# Patient Record
Sex: Male | Born: 2000 | Race: White | Hispanic: No | Marital: Single | Smoking: Never smoker
Health system: Southern US, Community
[De-identification: ages and names within clinical notes are randomized; demographics above are authoritative.]

## PROBLEM LIST (undated history)

## (undated) DIAGNOSIS — J302 Other seasonal allergic rhinitis: Secondary | ICD-10-CM

## (undated) DIAGNOSIS — J45909 Unspecified asthma, uncomplicated: Secondary | ICD-10-CM

## (undated) HISTORY — PX: TYMPANOSTOMY TUBE PLACEMENT: SHX32

## (undated) HISTORY — PX: HYPOSPADIAS CORRECTION: SHX483

## (undated) HISTORY — PX: TONSILLECTOMY: SUR1361

## (undated) HISTORY — PX: ADENOIDECTOMY: SUR15

---
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
## 2022-01-09 ENCOUNTER — Other Ambulatory Visit: Payer: Self-pay | Admitting: Sports Medicine

## 2022-01-09 DIAGNOSIS — M25562 Pain in left knee: Secondary | ICD-10-CM

## 2022-01-30 ENCOUNTER — Ambulatory Visit
Admission: RE | Admit: 2022-01-30 | Discharge: 2022-01-30 | Disposition: A | Discharge status: Home / Self Care | Payer: 59 | Source: Ambulatory Visit | Attending: Sports Medicine | Admitting: Sports Medicine

## 2022-01-30 DIAGNOSIS — M25562 Pain in left knee: Secondary | ICD-10-CM

## 2022-02-03 ENCOUNTER — Other Ambulatory Visit: Payer: Self-pay

## 2023-03-31 IMAGING — CT CT KNEE*L* W/O CM
3 of 4 series · 15 of 33 positions shown, 18 images · non-contrast
Comparison: None Available.

CLINICAL DATA: Knee fracture 5 months ago.  Follow-up.  No surgery.

EXAM:
CT OF THE LEFT KNEE WITHOUT CONTRAST
TECHNIQUE: Multidetector CT imaging of the left knee was performed according to
the standard protocol. Multiplanar CT image reconstructions were
also generated.
RADIATION DOSE REDUCTION: This exam was performed according to the
departmental dose-optimization program which includes automated
exposure control, adjustment of the mA and/or kV according to
patient size and/or use of iterative reconstruction technique.

[Series 9: lfov lower extremity 2.00 br40 s3 soft · coronal · 0.32mm/px · 3 of 94 slices shown (1 of 2)]
[im 19/94  bone]
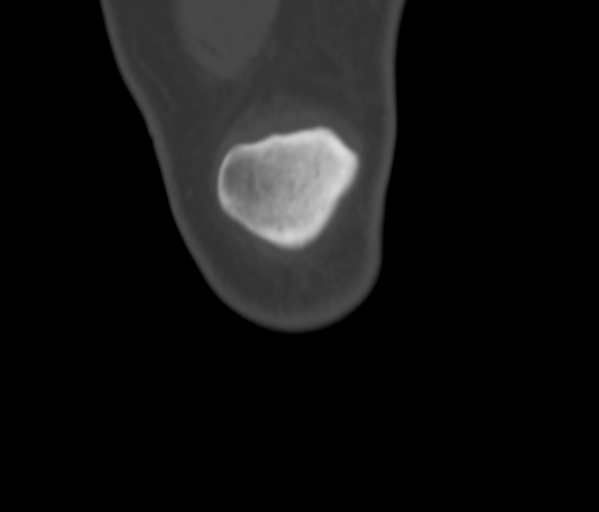
[im 38/94  bone]
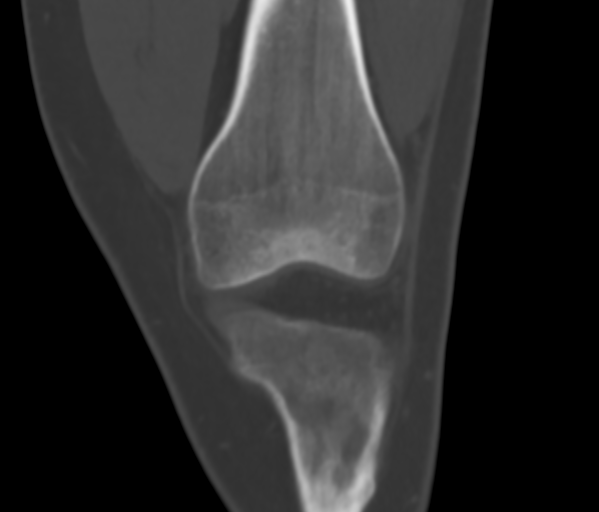
[im 56/94  bone]
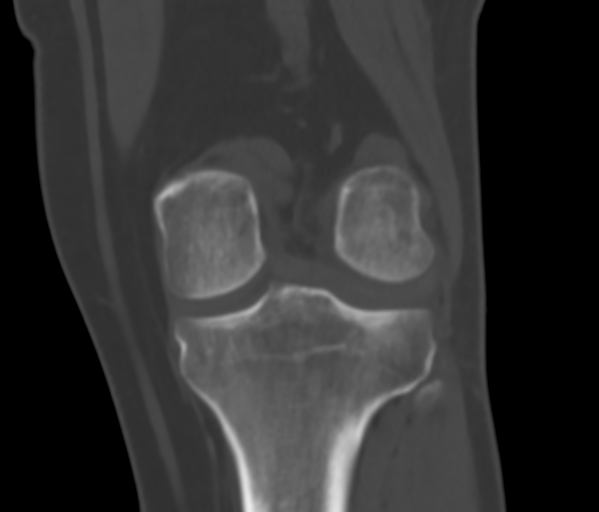

[Series 13: lfov lower extremity 2.00 br40 s3 soft · sagittal · 0.32mm/px · 5 of 96 slices shown, 6 images (2 of 2)]
[im 32/96  bone]
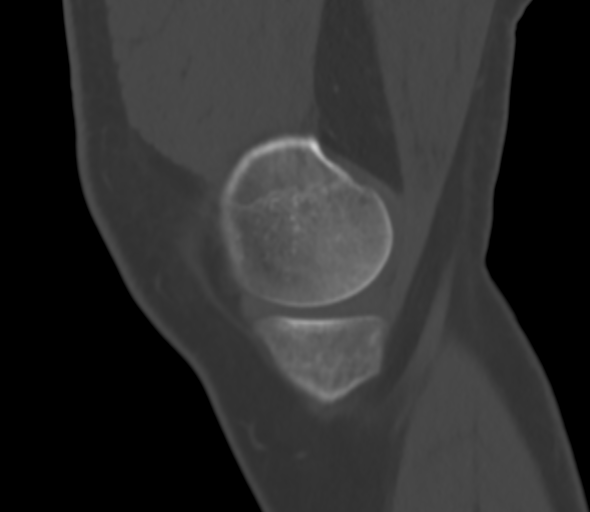
[im 40/96  bone]
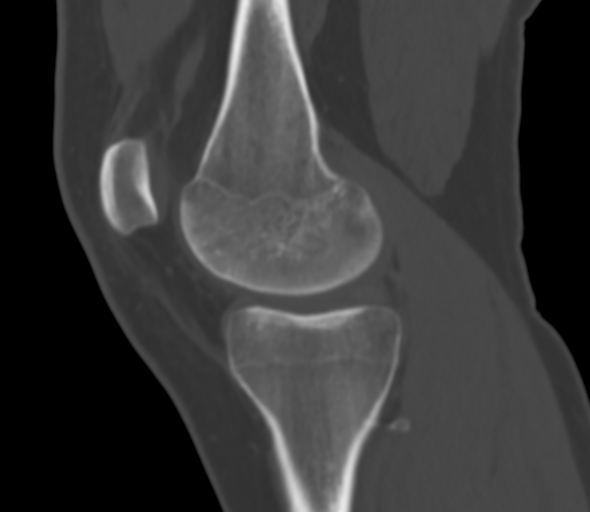
[im 48/96  soft-tissue]
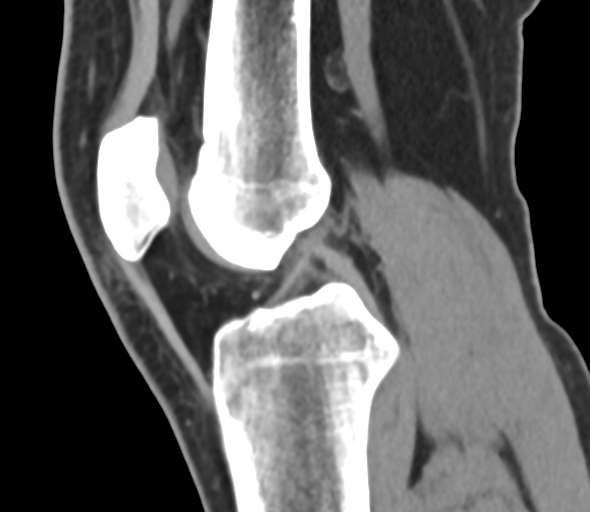
[im 48/96  bone]
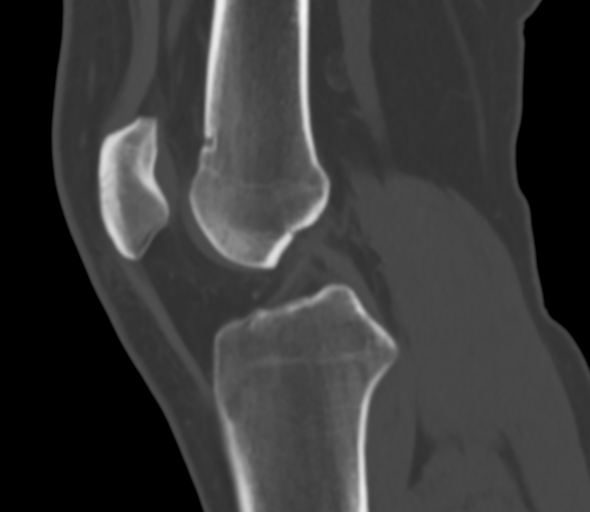
[im 56/96  bone]
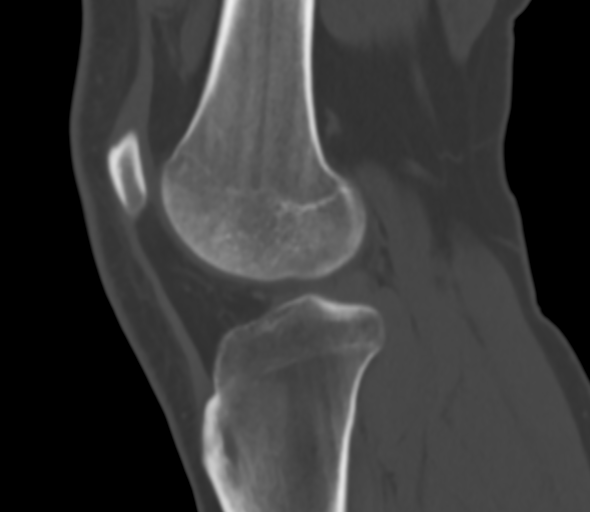
[im 64/96  bone]
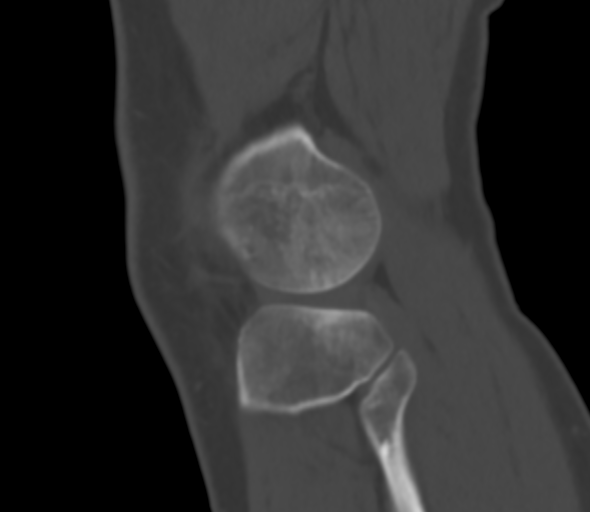

[Series 16: lfov lower extremity 0.60 br40 s3 soft thin · axial · 0.43mm/px · z∈[+911,+1041]mm · 7 of 273 slices shown, 9 images]
[im 28/273  soft-tissue]
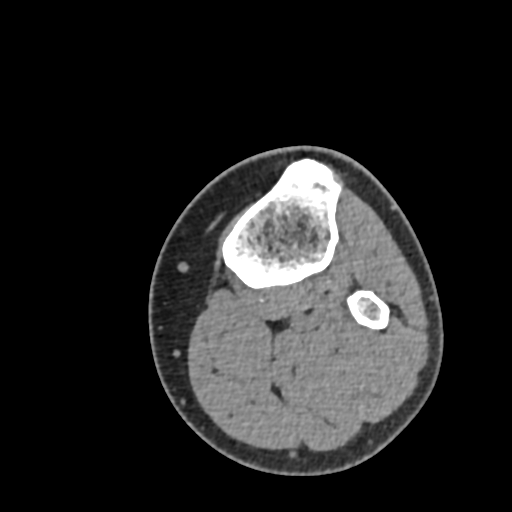
[im 28/273  bone]
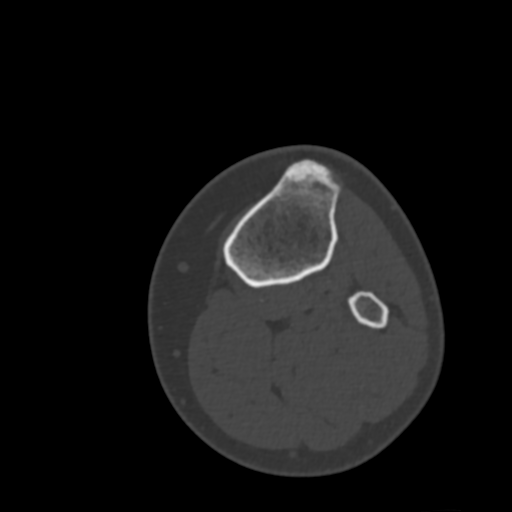
[im 55/273  bone]
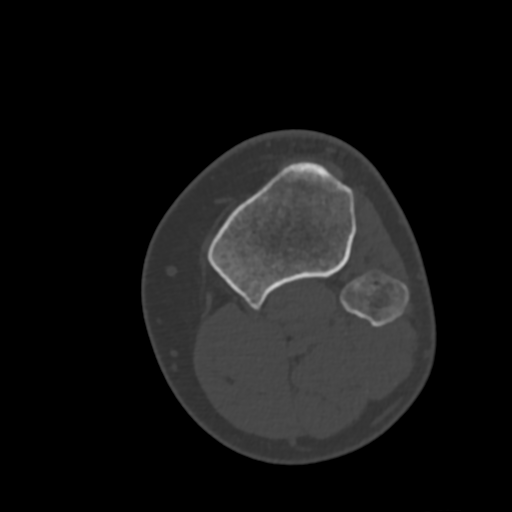
[im 109/273  bone]
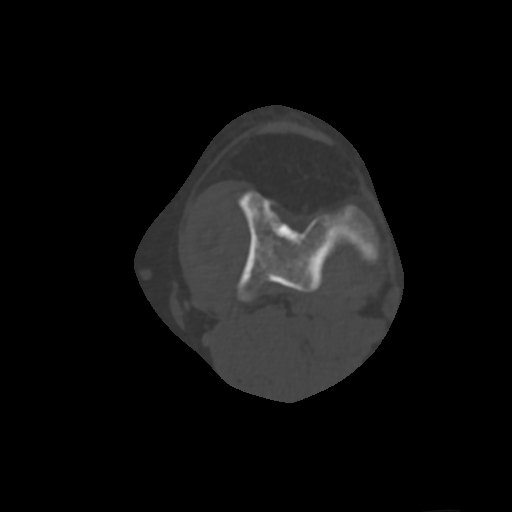
[im 137/273  bone]
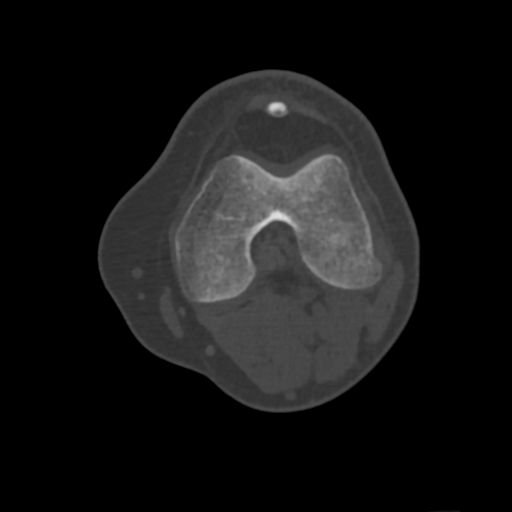
[im 164/273  soft-tissue]
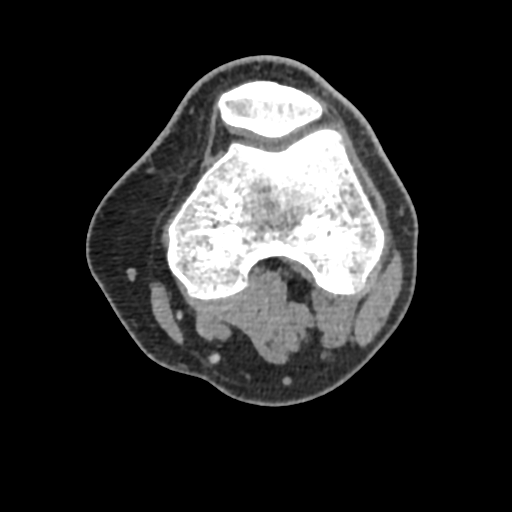
[im 164/273  bone]
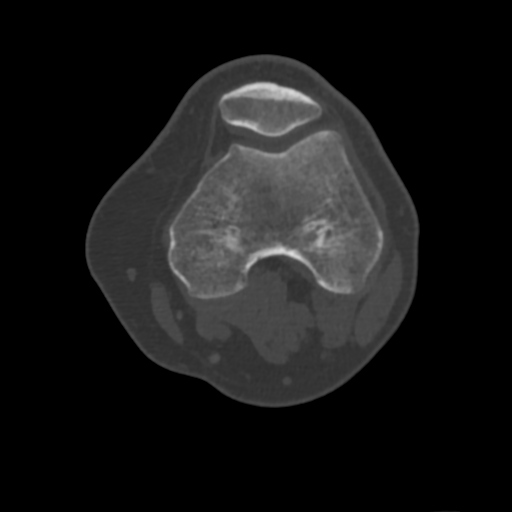
[im 218/273  bone]
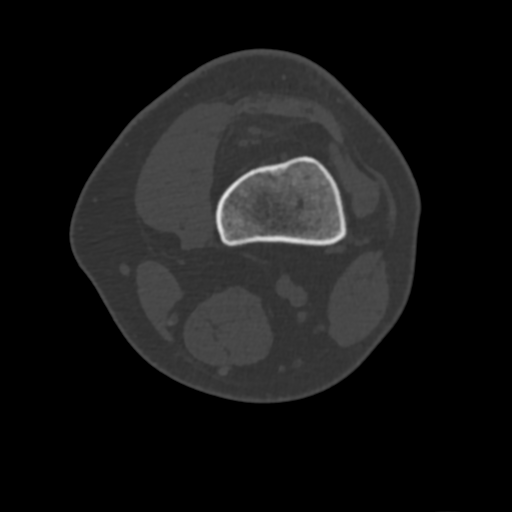
[im 245/273  bone]
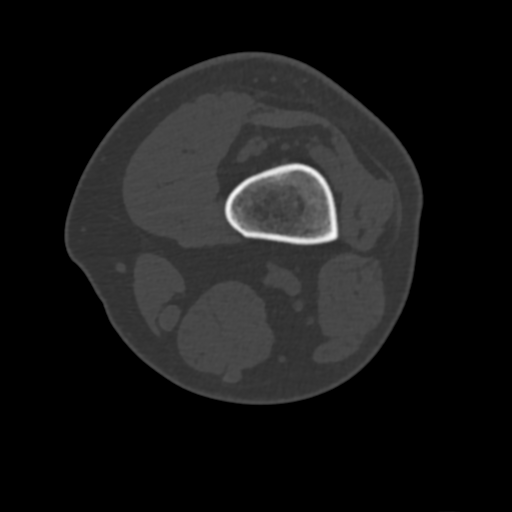

[15 of 33 positions shown; findings below may reference images not displayed]

FINDINGS: Bones/Joint/Cartilage

No evidence of fracture or dislocation. No appreciable joint
effusion. There is a sessile metaphyseal structure on the posterior
aspect of the tibia projected away from the epiphysis with medullary
continuity measuring up to 8 mm most consistent with osteochondroma.
There is no significant cartilaginous cap.

Ligaments

Suboptimally assessed by CT.

Muscles and Tendons

Muscles are normal in bulk and density without evidence of
intramuscular collection. No soft tissue mass. No evidence of tendon
tear. Quadriceps and patellar tendon are intact.

Soft tissues

Skin and subcutaneous soft tissues are within normal limits.
IMPRESSION: 1.  No acute osseous abnormality.

2. 8 mm osteochondroma on the posterior aspect of the tibial
metaphysis, a benign process.

## 2023-09-21 ENCOUNTER — Ambulatory Visit: Payer: 59 | Admitting: Podiatry

## 2023-09-21 ENCOUNTER — Telehealth: Payer: Self-pay | Admitting: Podiatry

## 2023-09-21 NOTE — Telephone Encounter (Signed)
Pts mom lvm on 1/17 at 912am to cxl appt.  I did call mom back and lvm
